# Patient Record
Sex: Male | Born: 1995 | Race: Black or African American | Hispanic: No | Marital: Single | Smoking: Never smoker
Health system: Southern US, Community
[De-identification: ages and names within clinical notes are randomized; demographics above are authoritative.]

## PROBLEM LIST (undated history)

## (undated) DIAGNOSIS — F988 Other specified behavioral and emotional disorders with onset usually occurring in childhood and adolescence: Secondary | ICD-10-CM

---
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
## 2016-07-18 ENCOUNTER — Ambulatory Visit (HOSPITAL_COMMUNITY)
Admission: EM | Admit: 2016-07-18 | Discharge: 2016-07-18 | Disposition: A | Discharge status: Home / Self Care | Payer: Medicaid Other | Attending: Family Medicine | Admitting: Family Medicine

## 2016-07-18 ENCOUNTER — Encounter (HOSPITAL_COMMUNITY): Payer: Self-pay

## 2016-07-18 DIAGNOSIS — S39012A Strain of muscle, fascia and tendon of lower back, initial encounter: Secondary | ICD-10-CM | POA: Diagnosis not present

## 2016-07-18 MED ORDER — DICLOFENAC POTASSIUM 50 MG PO TABS: 50.0000 mg | ORAL_TABLET | Freq: Three times a day (TID) | ORAL | 0 refills | Status: AC

## 2016-07-18 MED ORDER — METAXALONE 800 MG PO TABS: 800.0000 mg | ORAL_TABLET | Freq: Three times a day (TID) | ORAL | 0 refills | Status: AC

## 2016-07-18 NOTE — ED Triage Notes (Signed)
Pt has been having itchy ears for over a year and also having back pain for 3 months. 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 No otc meds tried.

## 2016-07-18 NOTE — ED Provider Notes (Signed)
[facility]    CSN: [id number] Arrival date & time: 07/18/16  1800     History   Chief Complaint Chief Complaint  Patient presents with  . Ear Problem  . Back Pain    HPI Jeffery Castro is a 21 y.o. male.   The history is provided by the patient.  Back Pain  Location:  Lumbar spine Quality:  Aching Radiates to:  Does not radiate Pain severity:  Mild Onset quality:  Gradual Duration:  3 months Timing:  Intermittent Progression:  Unchanged Chronicity:  New Context: lifting heavy objects   Context comment:  Works at [employer], lifting Relieved by:  None tried Worsened by:  Nothing Ineffective treatments:  None tried Associated symptoms: no abdominal pain, no bladder incontinence, no bowel incontinence, no leg pain, no numbness, no paresthesias, no pelvic pain, no perianal numbness and no tingling     Past Medical History:  Diagnosis Date  . Attention deficit disorder (ADD)     Patient Active Problem List   Diagnosis Date Noted  . Allergic rhinitis 09/06/2013  . Body mass index, pediatric, greater than or equal to 95th percentile for age 66/28/2014  . ADHD (attention deficit hyperactivity disorder) 01/20/2013  . Obesity 09/05/2011  . Family history of diabetes mellitus 09/05/2011  . Conduct disorder 03/13/2011  . Acne 11/08/2010  . PALPITATIONS 06/29/2009  . ADHD 12/15/2006  . ALLERGIC RHINITIS 12/15/2006    History reviewed. No pertinent surgical history.     Home Medications    Prior to Admission medications   Medication Sig Start Date End Date Taking? Authorizing Provider  adapalene (DIFFERIN) 0.1 % gel Apply topically at bedtime. 09/06/13   Shruti Oliva BustardSimha V, MD  cetirizine (ZYRTEC) 10 MG tablet Take 1 tablet (10 mg total) by mouth daily. 09/06/13   Shruti Oliva BustardSimha V, MD  clindamycin-benzoyl peroxide (BENZACLIN) gel Apply topically 2 (two) times daily. 09/06/13   Shruti Oliva BustardSimha V, MD  diclofenac (CATAFLAM) 50 MG tablet Take 1 tablet (50 mg total) by  mouth 3 (three) times daily. 07/18/16   Linna HoffJames D Lilyonna Steidle, MD  fluticasone (FLONASE) 50 MCG/ACT nasal spray Place 2 sprays into both nostrils daily. 09/06/13 09/06/14  Shruti Oliva BustardSimha V, MD  metaxalone (SKELAXIN) 800 MG tablet Take 1 tablet (800 mg total) by mouth 3 (three) times daily. 07/18/16   Linna HoffJames D Deitrick Ferreri, MD    Family History No family history on file.  Social History Social History  Substance Use Topics  . Smoking status: Never Smoker  . Smokeless tobacco: Never Used  . Alcohol use No     Allergies   Patient has no known allergies.   Review of Systems Review of Systems  Constitutional: Negative.   Gastrointestinal: Negative.  Negative for abdominal pain and bowel incontinence.  Genitourinary: Negative for bladder incontinence and pelvic pain.  Musculoskeletal: Positive for back pain and myalgias. Negative for gait problem and joint swelling.  Neurological: Negative for tingling, numbness and paresthesias.  All other systems reviewed and are negative.    Physical Exam Triage Vital Signs ED Triage Vitals [07/18/16 1852]  Enc Vitals Group     BP 155/73     Pulse Rate 83     Resp 20     Temp 98.7 F (37.1 C)     Temp Source Oral     SpO2 100 %     Weight      Height      Head Circumference      Peak Flow  Pain Score 3     Pain Loc      Pain Edu?      Excl. in [place]?    No data found.   Updated Vital Signs BP 155/73 (BP Location: Right Arm)   Pulse 83   Temp 98.7 F (37.1 C) (Oral)   Resp 20   SpO2 100%   Visual Acuity Right Eye Distance:   Left Eye Distance:   Bilateral Distance:    Right Eye Near:   Left Eye Near:    Bilateral Near:     Physical Exam  Constitutional: He is oriented to person, place, and time. He appears well-developed and well-nourished.  Abdominal: Soft. Bowel sounds are normal. There is no tenderness.  Musculoskeletal: He exhibits tenderness. He exhibits no deformity.       Lumbar back: He exhibits decreased range of motion,  tenderness, pain and spasm. He exhibits normal pulse.  Neurological: He is alert and oriented to person, place, and time.  Skin: Skin is warm and dry.  Nursing note and vitals reviewed.    UC Treatments / Results  Labs (all labs ordered are listed, but only abnormal results are displayed) Labs Reviewed - No data to display  EKG  EKG Interpretation None       Radiology No results found.  Procedures Procedures (including critical care time)  Medications Ordered in UC Medications - No data to display   Initial Impression / Assessment and Plan / UC Course  I have reviewed the triage vital signs and the nursing notes.  Pertinent labs & imaging results that were available during my care of the patient were reviewed by me and considered in my medical decision making (see chart for details).       Final Clinical Impressions(s) / UC Diagnoses   Final diagnoses:  Strain of lumbar region, initial encounter    New Prescriptions New Prescriptions   DICLOFENAC (CATAFLAM) 50 MG TABLET    Take 1 tablet (50 mg total) by mouth 3 (three) times daily.   METAXALONE (SKELAXIN) 800 MG TABLET    Take 1 tablet (800 mg total) by mouth 3 (three) times daily.     Linna Hoff, MD 07/18/16 [phone]

## 2019-05-03 ENCOUNTER — Encounter (HOSPITAL_COMMUNITY): Payer: Self-pay | Admitting: Family Medicine

## 2019-05-03 ENCOUNTER — Other Ambulatory Visit: Payer: Self-pay

## 2019-05-03 ENCOUNTER — Ambulatory Visit (HOSPITAL_COMMUNITY)
Admission: EM | Admit: 2019-05-03 | Discharge: 2019-05-03 | Disposition: A | Discharge status: Home / Self Care | Payer: Self-pay

## 2019-05-03 DIAGNOSIS — S29012A Strain of muscle and tendon of back wall of thorax, initial encounter: Secondary | ICD-10-CM

## 2019-05-03 NOTE — Discharge Instructions (Addendum)
Try stretching, use a back roller You can  wear a supportive device when working long hours. Rest Tylenol or ibuprofen as needed Follow up as needed for continued or worsening symptoms

## 2019-05-03 NOTE — ED Triage Notes (Signed)
Pt presents with bilateral back in flank area for over a week.

## 2019-05-03 NOTE — ED Provider Notes (Signed)
[facility]    CSN: [id number] Arrival date & time: 05/03/19  7829      History   Chief Complaint Chief Complaint  Patient presents with  . Appointment  . (9:00 am BACK PAIN)    HPI Jeffery Castro is a 23 y.o. male.   Patient is a 23 year old male who presents today with bilateral upper back pain.  This is been present, waxing waning over the past couple weeks.  Reporting he has been working 7 days a week at [employer] and been very busy.  He does a lot of repetitive movements with the upper back.  The discomfort is typically there after working.  The pain is resolved with resting.  No cough, chest pain or shortness of breath.  No injuries to the back.  He has not taken anything for his symptoms.  Reporting bad posture.  No numbness, tingling, weakness in upper extremities or radiation of pain.  ROS per HPI      Past Medical History:  Diagnosis Date  . Attention deficit disorder (ADD)     Patient Active Problem List   Diagnosis Date Noted  . Allergic rhinitis 09/06/2013  . Body mass index, pediatric, greater than or equal to 95th percentile for age 26/28/2014  . ADHD (attention deficit hyperactivity disorder) 01/20/2013  . Obesity 09/05/2011  . Family history of diabetes mellitus 09/05/2011  . Conduct disorder 03/13/2011  . Acne 11/08/2010  . PALPITATIONS 06/29/2009  . ADHD 12/15/2006  . ALLERGIC RHINITIS 12/15/2006    History reviewed. No pertinent surgical history.     Home Medications    Prior to Admission medications   Medication Sig Start Date End Date Taking? Authorizing Provider  adapalene (DIFFERIN) 0.1 % gel Apply topically at bedtime. 09/06/13   Marijo File, MD  cetirizine (ZYRTEC) 10 MG tablet Take 1 tablet (10 mg total) by mouth daily. 09/06/13   Marijo File, MD  clindamycin-benzoyl peroxide (BENZACLIN) gel Apply topically 2 (two) times daily. 09/06/13   Marijo File, MD  diclofenac (CATAFLAM) 50 MG tablet Take 1 tablet (50 mg  total) by mouth 3 (three) times daily. 07/18/16   Linna Hoff, MD  fluticasone (FLONASE) 50 MCG/ACT nasal spray Place 2 sprays into both nostrils daily. 09/06/13 09/06/14  Marijo File, MD  metaxalone (SKELAXIN) 800 MG tablet Take 1 tablet (800 mg total) by mouth 3 (three) times daily. 07/18/16   Linna Hoff, MD    Family History Family History  Family history unknown: Yes    Social History Social History   Tobacco Use  . Smoking status: Never Smoker  . Smokeless tobacco: Never Used  Substance Use Topics  . Alcohol use: No  . Drug use: No     Allergies   Patient has no known allergies.   Review of Systems Review of Systems   Physical Exam Triage Vital Signs ED Triage Vitals  Enc Vitals Group     BP 05/03/19 0904 (!) 171/90     Pulse Rate 05/03/19 0904 70     Resp 05/03/19 0904 18     Temp 05/03/19 0904 98.4 F (36.9 C)     Temp Source 05/03/19 0904 Oral     SpO2 05/03/19 0904 100 %     Weight --      Height --      Head Circumference --      Peak Flow --      Pain Score 05/03/19 0906 7  Pain Loc --      Pain Edu? --      Excl. in [place]? --    No data found.  Updated Vital Signs BP (!) 153/85 (BP Location: Left Arm)   Pulse 70   Temp 98.4 F (36.9 C) (Oral)   Resp 18   SpO2 100%   Visual Acuity Right Eye Distance:   Left Eye Distance:   Bilateral Distance:    Right Eye Near:   Left Eye Near:    Bilateral Near:     Physical Exam Vitals signs and nursing note reviewed.  Constitutional:      Appearance: Normal appearance.  HENT:     Head: Normocephalic and atraumatic.     Nose: Nose normal.  Eyes:     Conjunctiva/sclera: Conjunctivae normal.  Neck:     Musculoskeletal: Normal range of motion.  Pulmonary:     Effort: Pulmonary effort is normal.  Musculoskeletal: Normal range of motion.     Thoracic back: He exhibits tenderness. He exhibits normal range of motion, no bony tenderness, no swelling, no edema, no deformity, no laceration,  no pain, no spasm and normal pulse.       Back:  Skin:    General: Skin is warm and dry.  Neurological:     Mental Status: He is alert.  Psychiatric:        Mood and Affect: Mood normal.      UC Treatments / Results  Labs (all labs ordered are listed, but only abnormal results are displayed) Labs Reviewed - No data to display  EKG   Radiology No results found.  Procedures Procedures (including critical care time)  Medications Ordered in UC Medications - No data to display  Initial Impression / Assessment and Plan / UC Course  I have reviewed the triage vital signs and the nursing notes.  Pertinent labs & imaging results that were available during my care of the patient were reviewed by me and considered in my medical decision making (see chart for details).     Upper back strain-most likely from working long hours and doing upper body lifting and repetitive movements. Recommended stretching, using a back roller He could try pursing a supportive device to wear while working. Tylenol or ibuprofen as needed for pain Follow up as needed for continued or worsening symptoms  Final Clinical Impressions(s) / UC Diagnoses   Final diagnoses:  Upper back strain, initial encounter     Discharge Instructions     Try stretching, use a back roller You can  wear a supportive device when working long hours. Rest Tylenol or ibuprofen as needed Follow up as needed for continued or worsening symptoms     ED Prescriptions    None     PDMP not reviewed this encounter.   Orvan July, NP 05/03/19 [phone]
